# Patient Record
Sex: Male | Born: 1985 | Race: White | Hispanic: No | Marital: Single | Smoking: Current every day smoker
Health system: Southern US, Community
[De-identification: ages and names within clinical notes are randomized; demographics above are authoritative.]

## PROBLEM LIST (undated history)

## (undated) HISTORY — PX: KNEE SURGERY: SHX244

---
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
## 2011-02-18 ENCOUNTER — Emergency Department (HOSPITAL_COMMUNITY)
Admission: EM | Admit: 2011-02-18 | Discharge: 2011-02-19 | Disposition: A | Discharge status: Home / Self Care | Payer: 59 | Attending: Emergency Medicine | Admitting: Emergency Medicine

## 2011-02-18 DIAGNOSIS — IMO0002 Reserved for concepts with insufficient information to code with codable children: Secondary | ICD-10-CM | POA: Insufficient documentation

## 2011-02-18 DIAGNOSIS — T169XXA Foreign body in ear, unspecified ear, initial encounter: Secondary | ICD-10-CM | POA: Insufficient documentation

## 2015-09-10 ENCOUNTER — Encounter (HOSPITAL_COMMUNITY): Payer: Self-pay | Admitting: *Deleted

## 2015-09-10 ENCOUNTER — Emergency Department (INDEPENDENT_AMBULATORY_CARE_PROVIDER_SITE_OTHER): Payer: BLUE CROSS/BLUE SHIELD

## 2015-09-10 ENCOUNTER — Emergency Department (HOSPITAL_COMMUNITY)
Admission: EM | Admit: 2015-09-10 | Discharge: 2015-09-10 | Disposition: A | Discharge status: Home / Self Care | Payer: BLUE CROSS/BLUE SHIELD | Source: Home / Self Care | Attending: Family Medicine | Admitting: Family Medicine

## 2015-09-10 DIAGNOSIS — S61219A Laceration without foreign body of unspecified finger without damage to nail, initial encounter: Secondary | ICD-10-CM

## 2015-09-10 DIAGNOSIS — Z23 Encounter for immunization: Secondary | ICD-10-CM

## 2015-09-10 DIAGNOSIS — L089 Local infection of the skin and subcutaneous tissue, unspecified: Secondary | ICD-10-CM | POA: Diagnosis not present

## 2015-09-10 MED ORDER — TETANUS-DIPHTH-ACELL PERTUSSIS 5-2.5-18.5 LF-MCG/0.5 IM SUSP
INTRAMUSCULAR | Status: AC
  Filled 2015-09-10: qty 0.5

## 2015-09-10 MED ORDER — TETANUS-DIPHTH-ACELL PERTUSSIS 5-2.5-18.5 LF-MCG/0.5 IM SUSP
0.5000 mL | Freq: Once | INTRAMUSCULAR | Status: AC
  Administered 2015-09-10: 0.5 mL via INTRAMUSCULAR

## 2015-09-10 MED ORDER — CEPHALEXIN 500 MG PO CAPS: 500.0000 mg | ORAL_CAPSULE | Freq: Four times a day (QID) | ORAL | Status: AC

## 2015-09-10 NOTE — ED Notes (Signed)
Plan of  Care  discused  With  Patient  Reminded to  Remain npo   And followup  As  Directed  tommorow

## 2015-09-10 NOTE — ED Provider Notes (Signed)
CSN: [id number]     Arrival date & time 09/10/15  1701 History   First MD Initiated Contact with Patient 09/10/15 1819     Chief Complaint  Patient presents with  . Finger Injury   (Consider location/radiation/quality/duration/timing/severity/associated sxs/prior Treatment) Patient is a 30 y.o. male presenting with hand pain. The history is provided by the patient.  Hand Pain This is a new problem. The current episode started more than 1 week ago (3 wk old lac injury over lat pip joint of right 5th finger, began increasing pain, swelling purulent drainage 2d ago.). The problem has been gradually worsening. The symptoms are aggravated by bending.    History reviewed. No pertinent past medical history. Past Surgical History  Procedure Laterality Date  . Knee surgery     History reviewed. No pertinent family history. Social History  Substance Use Topics  . Smoking status: Current Every Day Smoker  . Smokeless tobacco: None  . Alcohol Use: No    Review of Systems  Musculoskeletal: Positive for joint swelling.  Skin: Positive for wound.  All other systems reviewed and are negative.   Allergies  Review of patient's allergies indicates no known allergies.  Home Medications   Prior to Admission medications   Medication Sig Start Date End Date Taking? Authorizing Provider  cephALEXin (KEFLEX) 500 MG capsule Take 1 capsule (500 mg total) by mouth 4 (four) times daily. Take all of medicine and drink lots of fluids 09/10/15   Linna Hoff, MD   Meds Ordered and Administered this Visit   Medications  Tdap (BOOSTRIX) injection 0.5 mL (0.5 mLs Intramuscular Given 09/10/15 1910)    BP 122/69 mmHg  Pulse 75  Temp(Src) 98.4 F (36.9 C) (Oral)  Resp 16  SpO2 100% No data found.   Physical Exam  Constitutional: He is oriented to person, place, and time. He appears well-developed and well-nourished. No distress.  Musculoskeletal: He exhibits tenderness.       Hands: Neurological:  He is alert and oriented to person, place, and time.  Skin: Skin is warm and dry.  Nursing note and vitals reviewed.   ED Course  Procedures (including critical care time)  Labs Review Labs Reviewed - No data to display  Imaging Review Dg Finger Little Right  09/10/2015  CLINICAL DATA:  Laceration to little finger 2 weeks ago. Now with pain and swelling in the region of previous laceration. EXAM: RIGHT LITTLE FINGER 2+V COMPARISON:  None. FINDINGS: Three views study shows no fracture. No subluxation dislocation. There is prominent soft tissue swelling posteriorly at the level of the PIP joint underlying bony erosion or destruction. No gas within the visualized soft tissues. IMPRESSION: Focal soft tissue swelling at the level of the PIP joint without underlying bony abnormality. Electronically Signed   By: Kennith Center M.D.   On: 09/10/2015 19:06     Visual Acuity Review  Right Eye Distance:   Left Eye Distance:   Bilateral Distance:    Right Eye Near:   Left Eye Near:    Bilateral Near:         MDM   1. Laceration of finger with infection, initial encounter    Discussed with dr Orlan Leavens, plans as suggested. Meds ordered this encounter  Medications  . Tdap (BOOSTRIX) injection 0.5 mL    Sig:   . cephALEXin (KEFLEX) 500 MG capsule    Sig: Take 1 capsule (500 mg total) by mouth 4 (four) times daily. Take all of medicine and drink  lots of fluids    Dispense:  28 capsule    Refill:  0      Linna Hoff, MD 09/10/15 [phone]

## 2015-09-10 NOTE — Discharge Instructions (Signed)
You had a tetanus booster, leave splinted, take antibiotic tonight , no eating or fluids after midnight tonight, go to see doctor at 9am on thurs for recheck.

## 2015-09-10 NOTE — ED Notes (Signed)
Pt reports    r   Small  Pinky  Appears  To  Be  Infected   He  States  He  Cut  It  On some  Metal  About  3  Weeks  Ago    And  It appears  To  Be  Infected  sev  Days  Ago

## 2016-09-14 IMAGING — DX DG FINGER LITTLE 2+V*R*
3 series · 3 of 3 positions shown · non-contrast
Comparison: None.

CLINICAL DATA: Laceration to little finger 2 weeks ago. Now with
pain and swelling in the region of previous laceration.

EXAM:
RIGHT LITTLE FINGER 2+V

[finger ap]
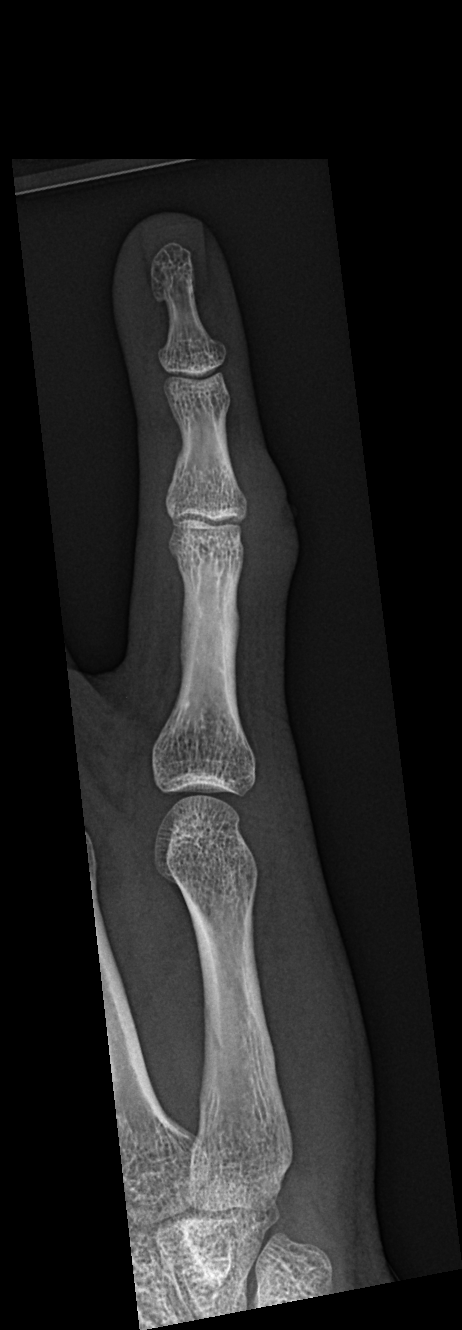

[finger obl]
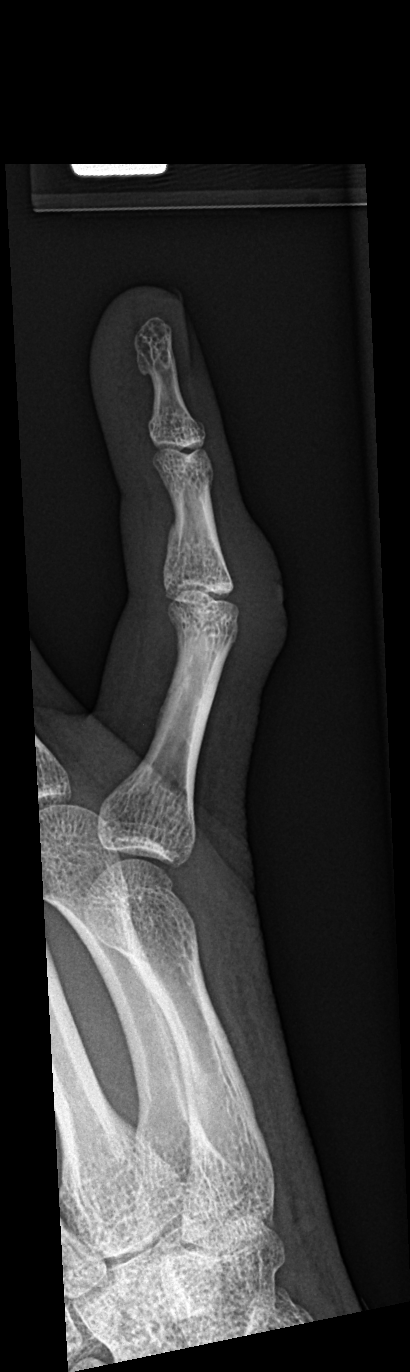

[finger lat]
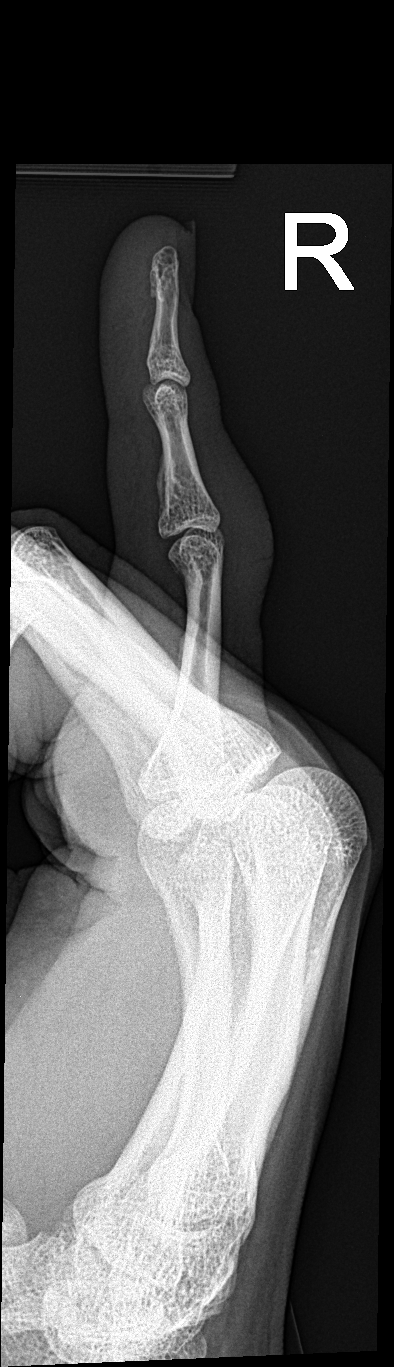

[3 of 3 positions shown; findings below may reference images not displayed]

FINDINGS: Three views study shows no fracture. No subluxation dislocation.
There is prominent soft tissue swelling posteriorly at the level of
the PIP joint underlying bony erosion or destruction. No gas within
the visualized soft tissues.
IMPRESSION: Focal soft tissue swelling at the level of the PIP joint without
underlying bony abnormality.

## 2019-03-30 ENCOUNTER — Other Ambulatory Visit: Payer: Self-pay

## 2019-03-30 DIAGNOSIS — Z20822 Contact with and (suspected) exposure to covid-19: Secondary | ICD-10-CM

## 2019-03-31 LAB — NOVEL CORONAVIRUS, NAA: SARS-CoV-2, NAA: NOT DETECTED

## 2022-12-28 ENCOUNTER — Encounter (HOSPITAL_BASED_OUTPATIENT_CLINIC_OR_DEPARTMENT_OTHER): Payer: Self-pay | Admitting: Emergency Medicine

## 2022-12-28 DIAGNOSIS — Z203 Contact with and (suspected) exposure to rabies: Secondary | ICD-10-CM

## 2022-12-28 NOTE — ED Notes (Signed)
(  Information removed from chart d/t incorrect charting on prior shift)

## 2022-12-28 NOTE — Discharge Instructions (Addendum)
You will need to return to [facility] for repeat vaccinations. The address is listed in the discharge paperwork. You can also call to schedule an appointment for these days as well. The dates are listed below. If you have any concerns, new or worsening symptoms, please return to the nearest ER for re-evaluation.   5/24 5/28 6/4   Get help right away if: You are bitten by a wild or stray animal. You have had any direct exposure to a bat. You have any symptoms of rabies infection. You have any of these signs of infection: More redness, swelling, or pain around your wound. Fluid or blood coming from your wound. Warmth coming from your wound. Pus or a bad smell coming from your wound. A fever. 

## 2022-12-28 NOTE — ED Triage Notes (Signed)
Pt arrives to ED with c/o possible rabies exposure after a bat flew into the house.
